# Patient Record
Sex: Female | Born: 1995
Health system: Southern US, Community
[De-identification: ages and names within clinical notes are randomized; demographics above are authoritative.]

## PROBLEM LIST (undated history)

## (undated) DIAGNOSIS — Z789 Other specified health status: Secondary | ICD-10-CM

## (undated) DIAGNOSIS — L309 Dermatitis, unspecified: Secondary | ICD-10-CM

## (undated) HISTORY — DX: Dermatitis, unspecified: L30.9

## (undated) HISTORY — PX: NO PAST SURGERIES: SHX2092

---
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
## 2017-05-08 ENCOUNTER — Encounter (HOSPITAL_COMMUNITY): Payer: Self-pay | Admitting: Emergency Medicine

## 2017-05-08 ENCOUNTER — Other Ambulatory Visit: Payer: Self-pay

## 2017-05-08 ENCOUNTER — Emergency Department (HOSPITAL_COMMUNITY)
Admission: EM | Admit: 2017-05-08 | Discharge: 2017-05-08 | Disposition: A | Discharge status: Home / Self Care | Payer: No Typology Code available for payment source | Attending: Emergency Medicine | Admitting: Emergency Medicine

## 2017-05-08 DIAGNOSIS — M7918 Myalgia, other site: Secondary | ICD-10-CM | POA: Insufficient documentation
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
## 2017-05-08 NOTE — Discharge Instructions (Signed)
Please read attached information. If you experience any new or worsening signs or symptoms please return to the emergency room for evaluation. Please follow-up with your primary care provider or specialist as discussed.  °

## 2017-05-10 ENCOUNTER — Encounter (HOSPITAL_BASED_OUTPATIENT_CLINIC_OR_DEPARTMENT_OTHER): Payer: Self-pay | Admitting: *Deleted

## 2017-05-10 ENCOUNTER — Emergency Department (HOSPITAL_BASED_OUTPATIENT_CLINIC_OR_DEPARTMENT_OTHER)
Admission: EM | Admit: 2017-05-10 | Discharge: 2017-05-10 | Disposition: A | Discharge status: Home / Self Care | Payer: No Typology Code available for payment source | Attending: Physician Assistant | Admitting: Physician Assistant

## 2017-05-10 ENCOUNTER — Emergency Department (HOSPITAL_BASED_OUTPATIENT_CLINIC_OR_DEPARTMENT_OTHER): Payer: No Typology Code available for payment source

## 2017-05-10 ENCOUNTER — Other Ambulatory Visit: Payer: Self-pay

## 2017-05-10 DIAGNOSIS — M25511 Pain in right shoulder: Secondary | ICD-10-CM | POA: Diagnosis not present

## 2017-05-10 DIAGNOSIS — M549 Dorsalgia, unspecified: Secondary | ICD-10-CM | POA: Insufficient documentation

## 2017-05-10 DIAGNOSIS — Z041 Encounter for examination and observation following transport accident: Secondary | ICD-10-CM | POA: Insufficient documentation

## 2017-05-10 LAB — PREGNANCY, URINE: Preg Test, Ur: NEGATIVE

## 2017-05-10 MED ORDER — CYCLOBENZAPRINE HCL 10 MG PO TABS: 10.0000 mg | ORAL_TABLET | Freq: Two times a day (BID) | ORAL | 0 refills | Status: DC | PRN

## 2017-05-10 MED FILL — CYCLOBENZAPRINE 10 MG TAB: 10 | 5 days supply | Qty: 10 | Fill #0

## 2017-05-10 NOTE — ED Notes (Signed)
ED Provider at bedside. 
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
## 2017-05-10 NOTE — ED Notes (Signed)
Pt educated about sedating properties of muscle relaxer (prescription) and importance of not driving or doing other critical tasks (such as operating heavy machinery, caring for infant/toddler/child) while taking medication. Also educated about not taking with alcohol or other medications with sedating properties (such as narcotics, Benadryl). Pt/caregiver verbalized understanding.  

## 2017-05-10 NOTE — Discharge Instructions (Signed)
Please follow up with the primary care physician as needed.
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
## 2017-08-10 ENCOUNTER — Other Ambulatory Visit: Payer: Self-pay

## 2017-08-10 ENCOUNTER — Inpatient Hospital Stay (HOSPITAL_COMMUNITY)
Admission: AD | Admit: 2017-08-10 | Discharge: 2017-08-10 | Disposition: A | Discharge status: Home / Self Care | Payer: BLUE CROSS/BLUE SHIELD | Source: Ambulatory Visit | Attending: Obstetrics and Gynecology | Admitting: Obstetrics and Gynecology

## 2017-08-10 ENCOUNTER — Encounter (HOSPITAL_COMMUNITY): Payer: Self-pay

## 2017-08-10 DIAGNOSIS — Z30016 Encounter for initial prescription of transdermal patch hormonal contraceptive device: Secondary | ICD-10-CM | POA: Diagnosis not present

## 2017-08-10 DIAGNOSIS — N939 Abnormal uterine and vaginal bleeding, unspecified: Secondary | ICD-10-CM

## 2017-08-10 DIAGNOSIS — Z8742 Personal history of other diseases of the female genital tract: Secondary | ICD-10-CM | POA: Diagnosis not present

## 2017-08-10 DIAGNOSIS — R109 Unspecified abdominal pain: Secondary | ICD-10-CM | POA: Diagnosis not present

## 2017-08-10 HISTORY — DX: Other specified health status: Z78.9

## 2017-08-10 LAB — CBC
HCT: 34.1 % — ABNORMAL LOW (ref 36.0–46.0)
Hemoglobin: 11.4 g/dL — ABNORMAL LOW (ref 12.0–15.0)
MCH: 26.1 pg (ref 26.0–34.0)
MCHC: 33.4 g/dL (ref 30.0–36.0)
MCV: 78 fL (ref 78.0–100.0)
Platelets: 275 10*3/uL (ref 150–400)
RBC: 4.37 MIL/uL (ref 3.87–5.11)
RDW: 14.5 % (ref 11.5–15.5)
WBC: 4.7 10*3/uL (ref 4.0–10.5)

## 2017-08-10 LAB — URINALYSIS, ROUTINE W REFLEX MICROSCOPIC

## 2017-08-10 LAB — URINALYSIS, MICROSCOPIC (REFLEX)

## 2017-08-10 LAB — POCT PREGNANCY, URINE: Preg Test, Ur: NEGATIVE

## 2017-08-10 MED ORDER — KETOROLAC TROMETHAMINE 60 MG/2ML IM SOLN
60.0000 mg | Freq: Once | INTRAMUSCULAR | Status: AC
  Administered 2017-08-10: 60 mg via INTRAMUSCULAR
  Filled 2017-08-10: qty 2

## 2017-08-10 MED ORDER — NORELGESTROMIN-ETH ESTRADIOL 150-35 MCG/24HR TD PTWK: 1.0000 | MEDICATED_PATCH | TRANSDERMAL | 0 refills | Status: DC

## 2017-08-10 MED ORDER — NORGESTIMATE-ETH ESTRADIOL 0.25-35 MG-MCG PO TABS: 1.0000 | ORAL_TABLET | Freq: Two times a day (BID) | ORAL | 0 refills | Status: DC

## 2017-08-10 NOTE — MAU Provider Note (Signed)
Chief Complaint: Vaginal Bleeding   First Provider Initiated Contact with Patient 08/10/17 1824      SUBJECTIVE HPI: Katie Fitzpatrick is a 22 y.o. G0P0 not currently pregnant who presents to maternity admissions reporting vaginal bleeding for 1 month. Pt reports hx of abnormal vaginal bleeding, was on birth control pills in the past but stopped taking medication in 2017 due to forgetting taking medication. She reports continued vaginal bleeding since January. She reports that the bleeding started off as her normal period, it continued for 7 days then started turning dark red and lightening up to "where the patient thought her bleeding was about to stop", then it continued. She was seen by her OB at [facility] on 07/25/17 and started on Prometrium 200MG  capsules, she took the medication for 10 days but her bleeding continued to get worse. She endorses abdominal cramping with bleeding that she rates pain a 4/10- has not taken medication for pain. She denies recent IC, vaginal itching/burning, urinary symptoms, h/a, dizziness, n/v, or fever/chills.    Past Medical History:  Diagnosis Date  . Medical history non-contributory    Past Surgical History:  Procedure Laterality Date  . NO PAST SURGERIES     Social History   Socioeconomic History  . Marital status: Unknown    Spouse name: Not on file  . Number of children: Not on file  . Years of education: Not on file  . Highest education level: Not on file  Social Needs  . Financial resource strain: Not on file  . Food insecurity - worry: Not on file  . Food insecurity - inability: Not on file  . Transportation needs - medical: Not on file  . Transportation needs - non-medical: Not on file  Occupational History  . Not on file  Tobacco Use  . Smoking status: Never Smoker  . Smokeless tobacco: Never Used  Substance and Sexual Activity  . Alcohol use: No    Frequency: Never  . Drug use: No  . Sexual activity: Not Currently    Birth  control/protection: None  Other Topics Concern  . Not on file  Social History Narrative  . Not on file   No current facility-administered medications on file prior to encounter.    Current Outpatient Medications on File Prior to Encounter  Medication Sig Dispense Refill  . cyclobenzaprine (FLEXERIL) 10 MG tablet Take 1 tablet (10 mg total) 2 (two) times daily as needed by mouth for muscle spasms. 10 tablet 0   No Known Allergies  ROS:  Review of Systems  Constitutional: Negative.   Respiratory: Negative.   Cardiovascular: Negative.   Gastrointestinal: Positive for abdominal pain. Negative for constipation, diarrhea, nausea and vomiting.  Genitourinary: Positive for menstrual problem and vaginal bleeding. Negative for difficulty urinating, dysuria, frequency, pelvic pain, urgency and vaginal pain.  Musculoskeletal: Negative.   Neurological: Negative.   Psychiatric/Behavioral: Negative.    I have reviewed patient's Past Medical Hx, Surgical Hx, Family Hx, Social Hx, medications and allergies.   Physical Exam   Patient Vitals for the past 24 hrs:  BP Temp Temp src Pulse Resp Height Weight  08/10/17 1810 125/75 98.6 F (37 C) Oral (!) 125 18 - -  08/10/17 1756 - - - - - 5\' 4"  (1.626 m) 253 lb (114.8 kg)   Constitutional: Well-developed, obese female in no acute distress.  Cardiovascular: normal rate Respiratory: normal effort GI: Abd soft, non-tender. Pos BS x 4 MS: Extremities nontender, no edema, normal ROM Neurologic: Alert and oriented  x 4.  GU: Neg CVAT.  PELVIC EXAM: Cervix pink, visually open, without lesion, moderate red vaginal bleeding actively coming from cervix, vaginal walls and external genitalia normal Bimanual exam: Cervix 0.5/long/high, firm, anterior, neg CMT, uterus nontender, nonenlarged, adnexa without tenderness, enlargement, or mass  LAB RESULTS Results for orders placed or performed during the hospital encounter of 08/10/17 (from the past 24 hour(s))   Pregnancy, urine POC     Status: None   Collection Time: 08/10/17  6:20 PM  Result Value Ref Range   Preg Test, Ur NEGATIVE NEGATIVE  CBC     Status: Abnormal   Collection Time: 08/10/17  6:48 PM  Result Value Ref Range   WBC 4.7 4.0 - 10.5 K/uL   RBC 4.37 3.87 - 5.11 MIL/uL   Hemoglobin 11.4 (L) 12.0 - 15.0 g/dL   HCT 16.134.1 (L) 09.636.0 - 04.546.0 %   MCV 78.0 78.0 - 100.0 fL   MCH 26.1 26.0 - 34.0 pg   MCHC 33.4 30.0 - 36.0 g/dL   RDW 40.914.5 81.111.5 - 91.415.5 %   Platelets 275 150 - 400 K/uL    MAU Management/MDM: Orders Placed This Encounter  Procedures  . Urinalysis, Routine w reflex microscopic  . CBC  . Pregnancy, urine POC  CBC- WNL UA- unable to be done due to excessive bleeding in cup   Meds ordered this encounter  Medications  . ketorolac (TORADOL) injection 60 mg  . norgestimate-ethinyl estradiol (ORTHO-CYCLEN,SPRINTEC,PREVIFEM) 0.25-35 MG-MCG tablet    Sig: Take 1 tablet by mouth 2 (two) times daily for 5 days.    Dispense:  1 Package    Refill:  0    Order Specific Question:   Supervising Provider    Answer:   CONSTANT, PEGGY [[id number]]  . norelgestromin-ethinyl estradiol (ORTHO EVRA) 150-35 MCG/24HR transdermal patch    Sig: Place 1 patch onto the skin once a week.    Dispense:  4 patch    Refill:  0    Order Specific Question:   Supervising Provider    Answer:   CONSTANT, PEGGY [[id number]]   Treatments in MAU included 60mg  Toradol IM for abdominal pain- pain relieved with medication treatment.   Discussed birth control methods with patient. Pt agrees to transdermal patch due to not remembering to take pills and being concerned with LARCs. Discussed pt's concerns and answered questions. Pt agrees to transdermal patch with her OBGYN managing.   Pt discharged. Pt stable at time of discharge.   ASSESSMENT 1. Abnormal uterine bleeding (AUB)   2. Abdominal cramping   3. History of irregular menstrual bleeding   4. Encounter for initial prescription of transdermal patch hormonal  contraceptive device     PLAN Discharge home. Pt stable at discharge.  Rx for Sprintec to be taken BID x5days to stop bleeding  Rx for transdermal patch to be started after bleeding has stopped  Follow up with OBGYN in 1 month for management of birth control or worsening symptoms   Allergies as of 08/10/2017   No Known Allergies     Medication List    TAKE these medications   cyclobenzaprine 10 MG tablet Commonly known as:  FLEXERIL Take 1 tablet (10 mg total) 2 (two) times daily as needed by mouth for muscle spasms.   norelgestromin-ethinyl estradiol 150-35 MCG/24HR transdermal patch Commonly known as:  ORTHO EVRA Place 1 patch onto the skin once a week. Start taking on:  08/15/2017   norgestimate-ethinyl estradiol 0.25-35 MG-MCG tablet Commonly known as:  ORTHO-CYCLEN,SPRINTEC,PREVIFEM Take 1 tablet by mouth 2 (two) times daily for 5 days.       Steward Drone  Certified Nurse-Midwife 08/10/2017  7:43 PM

## 2017-08-10 NOTE — Progress Notes (Addendum)
Assumed care of pt. Pt here due to heavy bleeding. Labs pending  UPT negative  1917: provider at bs re-assessing and going over the results of the labs.   1921: d/c instructions given with pt understanding. Pt left unit via ambulatory

## 2017-08-10 NOTE — MAU Note (Signed)
Pt presents to MAU with c/o of ongoing vaginal bleeding x 1 month. Pt has had intermittent abdominal cramping for over a week.

## 2017-08-10 NOTE — Discharge Instructions (Signed)
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
## 2019-04-21 ENCOUNTER — Encounter (INDEPENDENT_AMBULATORY_CARE_PROVIDER_SITE_OTHER): Payer: Self-pay

## 2019-12-17 ENCOUNTER — Other Ambulatory Visit: Payer: Self-pay

## 2019-12-17 ENCOUNTER — Encounter: Payer: Self-pay | Admitting: Nurse Practitioner

## 2019-12-17 ENCOUNTER — Ambulatory Visit (INDEPENDENT_AMBULATORY_CARE_PROVIDER_SITE_OTHER): Payer: 59 | Admitting: Nurse Practitioner

## 2019-12-17 VITALS — BP 118/64 | HR 90 | Temp 96.6°F | Ht 65.35 in | Wt 256.4 lb

## 2019-12-17 DIAGNOSIS — Z13220 Encounter for screening for lipoid disorders: Secondary | ICD-10-CM

## 2019-12-17 DIAGNOSIS — Z136 Encounter for screening for cardiovascular disorders: Secondary | ICD-10-CM

## 2019-12-17 DIAGNOSIS — L308 Other specified dermatitis: Secondary | ICD-10-CM | POA: Diagnosis not present

## 2019-12-17 DIAGNOSIS — Z0000 Encounter for general adult medical examination without abnormal findings: Secondary | ICD-10-CM

## 2019-12-17 DIAGNOSIS — R739 Hyperglycemia, unspecified: Secondary | ICD-10-CM | POA: Diagnosis not present

## 2019-12-17 DIAGNOSIS — Z6841 Body Mass Index (BMI) 40.0 and over, adult: Secondary | ICD-10-CM

## 2019-12-17 DIAGNOSIS — L309 Dermatitis, unspecified: Secondary | ICD-10-CM | POA: Insufficient documentation

## 2019-12-17 LAB — COMPREHENSIVE METABOLIC PANEL
ALT: 16 U/L (ref 0–35)
AST: 18 U/L (ref 0–37)
Albumin: 4.2 g/dL (ref 3.5–5.2)
Alkaline Phosphatase: 42 U/L (ref 39–117)
BUN: 10 mg/dL (ref 6–23)
CO2: 27 mEq/L (ref 19–32)
Calcium: 9.1 mg/dL (ref 8.4–10.5)
Chloride: 106 mEq/L (ref 96–112)
Creatinine, Ser: 0.68 mg/dL (ref 0.40–1.20)
GFR: 130.93 mL/min (ref >60.00)
Glucose, Bld: 117 mg/dL — ABNORMAL HIGH (ref 70–99)
Potassium: 4.1 mEq/L (ref 3.5–5.1)
Sodium: 140 mEq/L (ref 135–145)
Total Bilirubin: 0.2 mg/dL (ref 0.2–1.2)
Total Protein: 6.9 g/dL (ref 6.0–8.3)

## 2019-12-17 LAB — CBC
HCT: 36.8 % (ref 36.0–46.0)
Hemoglobin: 12.1 g/dL (ref 12.0–15.0)
MCHC: 32.9 g/dL (ref 30.0–36.0)
MCV: 77.1 fl — ABNORMAL LOW (ref 78.0–100.0)
Platelets: 285 10*3/uL (ref 150.0–400.0)
RBC: 4.78 Mil/uL (ref 3.87–5.11)
RDW: 16.5 % — ABNORMAL HIGH (ref 11.5–15.5)
WBC: 4.9 10*3/uL (ref 4.0–10.5)

## 2019-12-17 LAB — LIPID PANEL
Cholesterol: 141 mg/dL (ref 0–200)
HDL: 32 mg/dL — ABNORMAL LOW (ref >39.00)
LDL Cholesterol: 95 mg/dL (ref 0–99)
NonHDL: 108.61
Total CHOL/HDL Ratio: 4
Triglycerides: 67 mg/dL (ref 0.0–149.0)
VLDL: 13.4 mg/dL (ref 0.0–40.0)

## 2019-12-17 LAB — TSH: TSH: 2.71 u[IU]/mL (ref 0.35–4.50)

## 2019-12-17 LAB — HEMOGLOBIN A1C: Hgb A1c MFr Bld: 5.6 % (ref 4.6–6.5)

## 2019-12-17 MED ORDER — CERAVE SA RENEWING EX CREA: 1.0000 "application " | TOPICAL_CREAM | Freq: Two times a day (BID) | CUTANEOUS | Status: DC

## 2019-12-17 NOTE — Patient Instructions (Signed)
Go to lab for blood draw  Start DASH diet and daily exercise to promote weight loss and adequate glucose control.  We will obtain recent PAP results from your dermatologist  Thank you for choosing [facility] for your health needs.   Preventive Care 22-22 Years Old, Female Preventive care refers to lifestyle choices and visits with your health care provider that can promote health and wellness. At this stage in your life, you may start seeing a primary care physician instead of a pediatrician. Your health care is now your responsibility. Preventive care for young adults includes:  A yearly physical exam. This is also called an annual wellness visit.  Regular dental and eye exams.  Immunizations.  Screening for certain conditions.  Healthy lifestyle choices, such as diet and exercise. What can I expect for my preventive care visit? Physical exam Your health care provider may check:  Height and weight. These may be used to calculate body mass index (BMI), which is a measurement that tells if you are at a healthy weight.  Heart rate and blood pressure.  Body temperature. Counseling Your health care provider may ask you questions about:  Past medical problems and family medical history.  Alcohol, tobacco, and drug use.  Home and relationship well-being.  Access to firearms.  Emotional well-being.  Diet, exercise, and sleep habits.  Sexual activity and sexual health.  Method of birth control.  Menstrual cycle.  Pregnancy history. What immunizations do I need?  Influenza (flu) vaccine  This is recommended every year. Tetanus, diphtheria, and pertussis (Tdap) vaccine  You may need a Td booster every 10 years. Varicella (chickenpox) vaccine  You may need this vaccine if you have not already been vaccinated. Human papillomavirus (HPV) vaccine  If recommended by your health care provider, you may need three doses over 6 months. Measles, mumps, and  rubella (MMR) vaccine  You may need at least one dose of MMR. You may also need a second dose. Meningococcal conjugate (MenACWY) vaccine  One dose is recommended if you are 22-75 years old and a Market researcher living in a residence hall, or if you have one of several medical conditions. You may also need additional booster doses. Pneumococcal conjugate (PCV13) vaccine  You may need this if you have certain conditions and were not previously vaccinated. Pneumococcal polysaccharide (PPSV23) vaccine  You may need one or two doses if you smoke cigarettes or if you have certain conditions. Hepatitis A vaccine  You may need this if you have certain conditions or if you travel or work in places where you may be exposed to hepatitis A. Hepatitis B vaccine  You may need this if you have certain conditions or if you travel or work in places where you may be exposed to hepatitis B. Haemophilus influenzae type b (Hib) vaccine  You may need this if you have certain risk factors. You may receive vaccines as individual doses or as more than one vaccine together in one shot (combination vaccines). Talk with your health care provider about the risks and benefits of combination vaccines. What tests do I need? Blood tests  Lipid and cholesterol levels. These may be checked every 5 years starting at age 22.  Hepatitis C test.  Hepatitis B test. Screening  Pelvic exam and Pap test. This may be done every 3 years starting at age 22.  Sexually transmitted disease (STD) testing, if you are at risk.  BRCA-related cancer screening. This may be done if you have  a family history of breast, ovarian, tubal, or peritoneal cancers. Other tests  Tuberculosis skin test.  Vision and hearing tests.  Skin exam.  Breast exam. Follow these instructions at home: Eating and drinking   Eat a diet that includes fresh fruits and vegetables, whole grains, lean protein, and low-fat dairy  products.  Drink enough fluid to keep your urine pale yellow.  Do not drink alcohol if: ? Your health care provider tells you not to drink. ? You are pregnant, may be pregnant, or are planning to become pregnant. ? You are under the legal drinking age. In the [place] the legal drinking age is 22.  If you drink alcohol: ? Limit how much you have to 0-1 drink a day. ? Be aware of how much alcohol is in your drink. In the [place] one drink equals one 12 oz bottle of beer (355 mL), one 5 oz glass of wine (148 mL), or one 1 oz glass of hard liquor (44 mL). Lifestyle  Take daily care of your teeth and gums.  Stay active. Exercise at least 30 minutes 5 or more days of the week.  Do not use any products that contain nicotine or tobacco, such as cigarettes, e-cigarettes, and chewing tobacco. If you need help quitting, ask your health care provider.  Do not use drugs.  If you are sexually active, practice safe sex. Use a condom or other form of birth control (contraception) in order to prevent pregnancy and STIs (sexually transmitted infections). If you plan to become pregnant, see your health care provider for a pre-conception visit.  Find healthy ways to cope with stress, such as: ? Meditation, yoga, or listening to music. ? Journaling. ? Talking to a trusted person. ? Spending time with friends and family. Safety  Always wear your seat belt while driving or riding in a vehicle.  Do not drive if you have been drinking alcohol. Do not ride with someone who has been drinking.  Do not drive when you are tired or distracted. Do not text while driving.  Wear a helmet and other protective equipment during sports activities.  If you have firearms in your house, make sure you follow all gun safety procedures.  Seek help if you have been bullied, physically abused, or sexually abused.  Use the Internet responsibly to avoid dangers such as online bullying and online sex predators. What's  next?  Go to your health care provider once a year for a well check visit.  Ask your health care provider how often you should have your eyes and teeth checked.  Stay up to date on all vaccines. This information is not intended to replace advice given to you by your health care provider. Make sure you discuss any questions you have with your health care provider. Document Revised: 06/08/2018 Document Reviewed: 06/08/2018 Elsevier Patient Education  2020 [employer].

## 2019-12-17 NOTE — Assessment & Plan Note (Signed)
Managed by dermatologist 

## 2019-12-17 NOTE — Assessment & Plan Note (Signed)
Advised about her risk for diabetes, cardiovascular disease and OA. Advised to start heart healthy diet, decrease potion size and daily exercise( 30-86mins per day)

## 2019-12-17 NOTE — Progress Notes (Signed)
Subjective:    Patient ID: Katie Fitzpatrick, female    DOB: [birth year], 24 y.o.   MRN: [id number]  Patient presents today for complete physical   HPI Hx of Eczema: No improvement with triamcinolone cream Has appt with dermatologist today.  Sexual History (orientation,birth control, marital status, STD):use of OCP, up to date with pelvic and breast exam, completed by GYN per patient, no need for STD screen  Depression/Suicide: Depression screen [facility] 2/9 12/17/2019  Decreased Interest 0  Down, Depressed, Hopeless 0  PHQ - 2 Score 0   Vision:not needed  Dental:has upcoming appt  Immunizations: (TDAP, Hep C screen, Pneumovax, Influenza, zoster)  Health Maintenance  Topic Date Due  . COVID-19 Vaccine (1) Never done  . Pap Smear  Never done  . Pap Smear  Never done  . Tetanus Vaccine  12/16/2020*  .  Hepatitis C: One time screening is recommended by Center for Disease Control  (CDC) for  adults born from [birth year] through 1963.   12/16/2020*  . HIV Screening  12/16/2020*  . Flu Shot  01/27/2020  *Topic was postponed. The date shown is not the original due date.   Diet:regular.  Weight:  Wt Readings from Last 3 Encounters:  12/17/19 256 lb 6.4 oz (116.3 kg)  08/10/17 253 lb (114.8 kg) (>99 %, Z= 2.53)*  05/10/17 240 lb (108.9 kg) (>99 %, Z= 2.41)*   * Growth percentiles are based on CDC (Girls, 2-20 Years) data.    Exercise:none  Fall Risk: Fall Risk  12/17/2019  Falls in the past year? 0  Number falls in past yr: 0  Injury with Fall? 0   Medications and allergies reviewed with patient and updated if appropriate.  Patient Active Problem List   Diagnosis Date Noted  . Hyperglycemia 12/17/2019  . Eczema 12/17/2019    Current Outpatient Medications on File Prior to Visit  Medication Sig Dispense Refill  . cyclobenzaprine (FLEXERIL) 10 MG tablet Take 1 tablet (10 mg total) 2 (two) times daily as needed by mouth for muscle spasms. (Patient not taking: Reported on 12/17/2019)  10 tablet 0  . norelgestromin-ethinyl estradiol (ORTHO EVRA) 150-35 MCG/24HR transdermal patch Place 1 patch onto the skin once a week. (Patient not taking: Reported on 12/17/2019) 4 patch 0  . norgestimate-ethinyl estradiol (ORTHO-CYCLEN,SPRINTEC,PREVIFEM) 0.25-35 MG-MCG tablet Take 1 tablet by mouth 2 (two) times daily for 5 days. (Patient not taking: Reported on 12/17/2019) 1 Package 0   No current facility-administered medications on file prior to visit.    Past Medical History:  Diagnosis Date  . Medical history non-contributory     Past Surgical History:  Procedure Laterality Date  . NO PAST SURGERIES      Social History   Socioeconomic History  . Marital status: Single    Spouse name: Not on file  . Number of children: 0  . Years of education: Not on file  . Highest education level: Not on file  Occupational History  . Not on file  Tobacco Use  . Smoking status: Never Smoker  . Smokeless tobacco: Never Used  Substance and Sexual Activity  . Alcohol use: No  . Drug use: No  . Sexual activity: Not Currently    Birth control/protection: Pill  Other Topics Concern  . Not on file  Social History Narrative  . Not on file   Social Determinants of Health   Financial Resource Strain:   . Difficulty of Paying Living Expenses:   Food Insecurity:   . Worried  About Running Out of Food in the Last Year:   . Ran Out of Food in the Last Year:   Transportation Needs:   . Lack of Transportation (Medical):   Marland Kitchen Lack of Transportation (Non-Medical):   Physical Activity:   . Days of Exercise per Week:   . Minutes of Exercise per Session:   Stress:   . Feeling of Stress :   Social Connections:   . Frequency of Communication with Friends and Family:   . Frequency of Social Gatherings with Friends and Family:   . Attends Religious Services:   . Active Member of Clubs or Organizations:   . Attends Banker Meetings:   Marland Kitchen Marital Status:     Family History    Problem Relation Age of Onset  . Diabetes Maternal Grandmother         Review of Systems  Constitutional: Negative for fever, malaise/fatigue and weight loss.  HENT: Negative for congestion and sore throat.   Eyes:       Negative for visual changes  Respiratory: Negative for cough and shortness of breath.   Cardiovascular: Negative for chest pain, palpitations and leg swelling.  Gastrointestinal: Negative for blood in stool, constipation, diarrhea and heartburn.  Genitourinary: Negative for dysuria, frequency and urgency.  Musculoskeletal: Negative for falls, joint pain and myalgias.  Skin: Positive for rash.  Neurological: Negative for dizziness, sensory change and headaches.  Endo/Heme/Allergies: Does not bruise/bleed easily.  Psychiatric/Behavioral: Negative for depression, substance abuse and suicidal ideas. The patient is not nervous/anxious.    Objective:   Vitals:   12/17/19 0901  BP: 118/64  Pulse: 90  Temp: (!) 96.6 F (35.9 C)  SpO2: 97%   Body mass index is 42.21 kg/m.   Physical Examination:  Physical Exam Vitals reviewed.  Constitutional:      General: She is not in acute distress.    Appearance: She is well-developed. She is obese.  HENT:     Right Ear: Tympanic membrane, ear canal and external ear normal.     Left Ear: Tympanic membrane, ear canal and external ear normal.  Eyes:     Extraocular Movements: Extraocular movements intact.     Conjunctiva/sclera: Conjunctivae normal.  Neck:     Thyroid: No thyroid mass, thyromegaly or thyroid tenderness.  Cardiovascular:     Rate and Rhythm: Normal rate and regular rhythm.     Heart sounds: Normal heart sounds.  Pulmonary:     Effort: Pulmonary effort is normal. No respiratory distress.     Breath sounds: Normal breath sounds.  Chest:     Chest wall: No tenderness.  Abdominal:     General: Bowel sounds are normal.     Palpations: Abdomen is soft.  Musculoskeletal:        General: Normal range  of motion.     Cervical back: Normal range of motion and neck supple.  Lymphadenopathy:     Cervical: No cervical adenopathy.  Skin:    General: Skin is warm and dry.     Findings: Rash present.  Neurological:     Mental Status: She is alert and oriented to person, place, and time.     Deep Tendon Reflexes: Reflexes are normal and symmetric.  Psychiatric:        Mood and Affect: Mood normal.        Behavior: Behavior normal.        Thought Content: Thought content normal.     ASSESSMENT and PLAN: This visit  occurred during the SARS-CoV-2 public health emergency.  Safety protocols were in place, including screening questions prior to the visit, additional usage of staff PPE, and extensive cleaning of exam room while observing appropriate contact time as indicated for disinfecting solutions.   Katie Fitzpatrick was seen today for establish care.  Diagnoses and all orders for this visit:  Preventative health care -     CBC -     Comprehensive metabolic panel -     Lipid panel -     TSH  Encounter for lipid screening for cardiovascular disease -     Lipid panel  Hyperglycemia -     Hemoglobin A1c  Other eczema -     Emollient (CERAVE SA RENEWING) CREA; Apply 1 application topically in the morning and at bedtime.    No problem-specific Assessment & Plan notes found for this encounter.      Problem List Items Addressed This Visit      Musculoskeletal and Integument   Eczema   Relevant Medications   Emollient (CERAVE SA RENEWING) CREA     Other   Hyperglycemia   Relevant Orders   Hemoglobin A1c    Other Visit Diagnoses    Preventative health care    -  Primary   Relevant Orders   CBC   Comprehensive metabolic panel   Lipid panel   TSH   Encounter for lipid screening for cardiovascular disease       Relevant Orders   Lipid panel      Follow up: Return in about 1 year (around 12/16/2020) for CPE (fasting).  Wilfred Lacy, NP

## 2020-01-04 ENCOUNTER — Encounter: Payer: Self-pay | Admitting: Nurse Practitioner

## 2020-07-19 ENCOUNTER — Emergency Department (HOSPITAL_BASED_OUTPATIENT_CLINIC_OR_DEPARTMENT_OTHER)
Admission: EM | Admit: 2020-07-19 | Discharge: 2020-07-19 | Disposition: A | Discharge status: Home / Self Care | Payer: 59 | Attending: Emergency Medicine | Admitting: Emergency Medicine

## 2020-07-19 ENCOUNTER — Encounter (HOSPITAL_BASED_OUTPATIENT_CLINIC_OR_DEPARTMENT_OTHER): Payer: Self-pay | Admitting: Emergency Medicine

## 2020-07-19 ENCOUNTER — Other Ambulatory Visit: Payer: Self-pay

## 2020-07-19 DIAGNOSIS — Y92410 Unspecified street and highway as the place of occurrence of the external cause: Secondary | ICD-10-CM | POA: Insufficient documentation

## 2020-07-19 DIAGNOSIS — S161XXA Strain of muscle, fascia and tendon at neck level, initial encounter: Secondary | ICD-10-CM | POA: Diagnosis not present

## 2020-07-19 DIAGNOSIS — T148XXA Other injury of unspecified body region, initial encounter: Secondary | ICD-10-CM

## 2020-07-19 DIAGNOSIS — S199XXA Unspecified injury of neck, initial encounter: Secondary | ICD-10-CM | POA: Diagnosis present

## 2020-07-19 DIAGNOSIS — M25512 Pain in left shoulder: Secondary | ICD-10-CM | POA: Insufficient documentation

## 2020-07-19 MED ORDER — CYCLOBENZAPRINE HCL 10 MG PO TABS: 10.0000 mg | ORAL_TABLET | Freq: Every day | ORAL | 0 refills | Status: DC
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
## 2020-07-19 NOTE — Discharge Instructions (Signed)
I recommend a combination of tylenol and ibuprofen for management of your pain. You can take a low dose of both at the same time. I recommend 325 mg of Tylenol combined with 400 mg of ibuprofen. This is one regular Tylenol and two regular ibuprofen. You can take these 2-3 times for day for your pain. Please try to take these medications with a small amount of food as well to prevent upsetting your stomach.  Also, please consider topical pain relieving creams such as Voltaran Gel, BioFreeze, or Icy Hot. There is also a pain relieving cream made by Aleve. You should be able to find all of these at your local pharmacy.   I am prescribing you a strong muscle relaxer called flexeril. Please only take this medication once in the evening with dinner. This medication can make you quite drowsy. Do not mix it with alcohol. Do not drive a vehicle after taking it.   Please return to the ER with any new or worsening symptoms.  It was a pleasure to meet you. 
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
## 2020-07-19 NOTE — ED Notes (Signed)
Reviewed AVS with client, discussed Rx for Flexeril with client and safety while taking this medication. Opportunity for questions provided

## 2021-03-17 ENCOUNTER — Other Ambulatory Visit: Payer: Self-pay

## 2021-03-17 ENCOUNTER — Encounter: Payer: Self-pay | Admitting: Nurse Practitioner

## 2021-03-17 ENCOUNTER — Ambulatory Visit (INDEPENDENT_AMBULATORY_CARE_PROVIDER_SITE_OTHER): Payer: 59 | Admitting: Nurse Practitioner

## 2021-03-17 VITALS — BP 112/70 | HR 81 | Temp 98.7°F | Ht 65.0 in | Wt 246.0 lb

## 2021-03-17 DIAGNOSIS — R739 Hyperglycemia, unspecified: Secondary | ICD-10-CM | POA: Diagnosis not present

## 2021-03-17 DIAGNOSIS — Z136 Encounter for screening for cardiovascular disorders: Secondary | ICD-10-CM

## 2021-03-17 DIAGNOSIS — Z0000 Encounter for general adult medical examination without abnormal findings: Secondary | ICD-10-CM

## 2021-03-17 DIAGNOSIS — Z13220 Encounter for screening for lipoid disorders: Secondary | ICD-10-CM

## 2021-03-17 LAB — CBC WITH DIFFERENTIAL/PLATELET
Basophils Absolute: 0 10*3/uL (ref 0.0–0.1)
Basophils Relative: 0.3 % (ref 0.0–3.0)
Eosinophils Absolute: 0.1 10*3/uL (ref 0.0–0.7)
Eosinophils Relative: 1.3 % (ref 0.0–5.0)
HCT: 36.9 % (ref 36.0–46.0)
Hemoglobin: 11.8 g/dL — ABNORMAL LOW (ref 12.0–15.0)
Lymphocytes Relative: 46.4 % — ABNORMAL HIGH (ref 12.0–46.0)
Lymphs Abs: 2.6 10*3/uL (ref 0.7–4.0)
MCHC: 32 g/dL (ref 30.0–36.0)
MCV: 77.7 fl — ABNORMAL LOW (ref 78.0–100.0)
Monocytes Absolute: 0.3 10*3/uL (ref 0.1–1.0)
Monocytes Relative: 5 % (ref 3.0–12.0)
Neutro Abs: 2.6 10*3/uL (ref 1.4–7.7)
Neutrophils Relative %: 47 % (ref 43.0–77.0)
Platelets: 304 10*3/uL (ref 150.0–400.0)
RBC: 4.75 Mil/uL (ref 3.87–5.11)
RDW: 14.7 % (ref 11.5–15.5)
WBC: 5.5 10*3/uL (ref 4.0–10.5)

## 2021-03-17 LAB — LIPID PANEL
Cholesterol: 133 mg/dL (ref 0–200)
HDL: 41.8 mg/dL (ref >39.00)
LDL Cholesterol: 80 mg/dL (ref 0–99)
NonHDL: 91.57
Total CHOL/HDL Ratio: 3
Triglycerides: 57 mg/dL (ref 0.0–149.0)
VLDL: 11.4 mg/dL (ref 0.0–40.0)

## 2021-03-17 LAB — COMPREHENSIVE METABOLIC PANEL
ALT: 13 U/L (ref 0–35)
AST: 16 U/L (ref 0–37)
Albumin: 4 g/dL (ref 3.5–5.2)
Alkaline Phosphatase: 35 U/L — ABNORMAL LOW (ref 39–117)
BUN: 8 mg/dL (ref 6–23)
CO2: 29 mEq/L (ref 19–32)
Calcium: 9.2 mg/dL (ref 8.4–10.5)
Chloride: 106 mEq/L (ref 96–112)
Creatinine, Ser: 0.64 mg/dL (ref 0.40–1.20)
GFR: 124.73 mL/min (ref >60.00)
Glucose, Bld: 82 mg/dL (ref 70–99)
Potassium: 3.9 mEq/L (ref 3.5–5.1)
Sodium: 141 mEq/L (ref 135–145)
Total Bilirubin: 0.4 mg/dL (ref 0.2–1.2)
Total Protein: 7.5 g/dL (ref 6.0–8.3)

## 2021-03-17 LAB — HEMOGLOBIN A1C: Hgb A1c MFr Bld: 5.9 % (ref 4.6–6.5)

## 2021-03-17 NOTE — Patient Instructions (Addendum)
Go to lab for blood draw. Maintain heart healthy diet and daily exercise to promote weight loss and adequate glucose control.  Preventive Care 72-23 Years Old, Female Preventive care refers to lifestyle choices and visits with your health care provider that can promote health and wellness. This includes: A yearly physical exam. This is also called an annual wellness visit. Regular dental and eye exams. Immunizations. Screening for certain conditions. Healthy lifestyle choices, such as: Eating a healthy diet. Getting regular exercise. Not using drugs or products that contain nicotine and tobacco. Limiting alcohol use. What can I expect for my preventive care visit? Physical exam Your health care provider may check your: Height and weight. These may be used to calculate your BMI (body mass index). BMI is a measurement that tells if you are at a healthy weight. Heart rate and blood pressure. Body temperature. Skin for abnormal spots. Counseling Your health care provider may ask you questions about your: Past medical problems. Family's medical history. Alcohol, tobacco, and drug use. Emotional well-being. Home life and relationship well-being. Sexual activity. Diet, exercise, and sleep habits. Work and work Statistician. Access to firearms. Method of birth control. Menstrual cycle. Pregnancy history. What immunizations do I need? Vaccines are usually given at various ages, according to a schedule. Your health care provider will recommend vaccines for you based on your age, medical history, and lifestyle or other factors, such as travel or where you work. What tests do I need? Blood tests Lipid and cholesterol levels. These may be checked every 5 years starting at age 66. Hepatitis C test. Hepatitis B test. Screening Diabetes screening. This is done by checking your blood sugar (glucose) after you have not eaten for a while (fasting). STD (sexually transmitted disease) testing,  if you are at risk. BRCA-related cancer screening. This may be done if you have a family history of breast, ovarian, tubal, or peritoneal cancers. Pelvic exam and Pap test. This may be done every 3 years starting at age 43. Starting at age 34, this may be done every 5 years if you have a Pap test in combination with an HPV test. Talk with your health care provider about your test results, treatment options, and if necessary, the need for more tests. Follow these instructions at home: Eating and drinking  Eat a healthy diet that includes fresh fruits and vegetables, whole grains, lean protein, and low-fat dairy products. Take vitamin and mineral supplements as recommended by your health care provider. Do not drink alcohol if: Your health care provider tells you not to drink. You are pregnant, may be pregnant, or are planning to become pregnant. If you drink alcohol: Limit how much you have to 0-1 drink a day. Be aware of how much alcohol is in your drink. In the [place] one drink equals one 12 oz bottle of beer (355 mL), one 5 oz glass of wine (148 mL), or one 1 oz glass of hard liquor (44 mL). Lifestyle Take daily care of your teeth and gums. Brush your teeth every morning and night with fluoride toothpaste. Floss one time each day. Stay active. Exercise for at least 30 minutes 5 or more days each week. Do not use any products that contain nicotine or tobacco, such as cigarettes, e-cigarettes, and chewing tobacco. If you need help quitting, ask your health care provider. Do not use drugs. If you are sexually active, practice safe sex. Use a condom or other form of protection to prevent STIs (sexually transmitted infections). If you do  not wish to become pregnant, use a form of birth control. If you plan to become pregnant, see your health care provider for a prepregnancy visit. Find healthy ways to cope with stress, such as: Meditation, yoga, or listening to music. Journaling. Talking to a  trusted person. Spending time with friends and family. Safety Always wear your seat belt while driving or riding in a vehicle. Do not drive: If you have been drinking alcohol. Do not ride with someone who has been drinking. When you are tired or distracted. While texting. Wear a helmet and other protective equipment during sports activities. If you have firearms in your house, make sure you follow all gun safety procedures. Seek help if you have been physically or sexually abused. What's next? Go to your health care provider once a year for an annual wellness visit. Ask your health care provider how often you should have your eyes and teeth checked. Stay up to date on all vaccines. This information is not intended to replace advice given to you by your health care provider. Make sure you discuss any questions you have with your health care provider. Document Revised: 08/22/2020 Document Reviewed: 02/23/2018 Elsevier Patient Education  2022 [employer].

## 2021-03-17 NOTE — Assessment & Plan Note (Signed)
dupixent injection prescribed by [facility] dermatology

## 2021-03-17 NOTE — Progress Notes (Signed)
Subjective:    Patient ID: Katie Fitzpatrick, female    DOB: 1996-06-15, 25 y.o.   MRN: [id number]  Patient presents today for CPE   HPI  Vision:not needed per patient Dental:will schedule Diet:regular Exercise:none Weight:  Wt Readings from Last 3 Encounters:  03/17/21 246 lb (111.6 kg)  07/19/20 252 lb (114.3 kg)  12/17/19 256 lb 6.4 oz (116.3 kg)    Sexual History (orientation,birth control, marital status, STD):sexually active, denies need for STD screen, up to date with PAP, agreed to breast exam today  Depression/Suicide: Depression screen [facility] 2/9 03/17/2021 12/17/2019  Decreased Interest 0 0  Down, Depressed, Hopeless 0 0  PHQ - 2 Score 0 0   Immunizations: (TDAP, Hep C screen, Pneumovax, Influenza, zoster)  Health Maintenance  Topic Date Due   COVID-19 Vaccine (1) 04/02/2021*   Flu Shot  09/25/2021*   Tetanus Vaccine  03/17/2022*   Hepatitis C Screening: USPSTF Recommendation to screen - Ages 18-79 yo.  03/17/2022*   HIV Screening  03/17/2022*   Pap Smear  05/15/2022   Pap Smear  05/15/2022   HPV Vaccine  Completed  *Topic was postponed. The date shown is not the original due date.   Fall Risk: Fall Risk  03/17/2021 12/17/2019  Falls in the past year? 0 0  Number falls in past yr: 0 0  Injury with Fall? 0 0  Risk for fall due to : No Fall Risks -  Follow up Falls evaluation completed -   Medications and allergies reviewed with patient and updated if appropriate.  Patient Active Problem List   Diagnosis Date Noted   Hyperglycemia 12/17/2019   Eczema 12/17/2019   Class 3 severe obesity due to excess calories with serious comorbidity and body mass index (BMI) of 40.0 to 44.9 in adult [facility]) 12/17/2019    Current Outpatient Medications on File Prior to Visit  Medication Sig Dispense Refill   DUPIXENT 300 MG/2ML SOPN SMARTSIG:1 Pre-Filled Pen Syringe SUB-Q Every 2 Weeks     triamcinolone cream (KENALOG) 0.1 % Apply topically.     No current  facility-administered medications on file prior to visit.    Past Medical History:  Diagnosis Date   Eczema    Medical history non-contributory     Past Surgical History:  Procedure Laterality Date   NO PAST SURGERIES      Social History   Socioeconomic History   Marital status: Single    Spouse name: Not on file   Number of children: 0   Years of education: Not on file   Highest education level: Not on file  Occupational History   Not on file  Tobacco Use   Smoking status: Never   Smokeless tobacco: Never  Vaping Use   Vaping Use: Never used  Substance and Sexual Activity   Alcohol use: Yes    Comment: socially   Drug use: Yes    Types: Marijuana   Sexual activity: Not Currently    Birth control/protection: None  Other Topics Concern   Not on file  Social History Narrative   Not on file   Social Determinants of Health   Financial Resource Strain: Not on file  Food Insecurity: Not on file  Transportation Needs: Not on file  Physical Activity: Not on file  Stress: Not on file  Social Connections: Not on file    Family History  Problem Relation Age of Onset   Diabetes Maternal Grandmother         Review of  Systems  Constitutional:  Negative for fever, malaise/fatigue and weight loss.  HENT:  Negative for congestion and sore throat.   Eyes:        Negative for visual changes  Respiratory:  Negative for cough and shortness of breath.   Cardiovascular:  Negative for chest pain, palpitations and leg swelling.  Gastrointestinal:  Negative for blood in stool, constipation, diarrhea and heartburn.  Genitourinary:  Negative for dysuria, frequency and urgency.  Musculoskeletal:  Negative for falls, joint pain and myalgias.  Skin:  Negative for rash.  Neurological:  Negative for dizziness, sensory change and headaches.  Endo/Heme/Allergies:  Does not bruise/bleed easily.  Psychiatric/Behavioral:  Negative for depression, substance abuse and suicidal ideas.  The patient is not nervous/anxious.    Objective:   Vitals:   03/17/21 1335  BP: 112/70  Pulse: 81  Temp: 98.7 F (37.1 C)  SpO2: 97%    Body mass index is 40.94 kg/m.   Physical Examination:  Physical Exam Vitals reviewed.  Constitutional:      General: She is not in acute distress.    Appearance: She is well-developed. She is obese.  HENT:     Right Ear: Tympanic membrane, ear canal and external ear normal.     Left Ear: Tympanic membrane, ear canal and external ear normal.  Eyes:     Extraocular Movements: Extraocular movements intact.     Conjunctiva/sclera: Conjunctivae normal.  Cardiovascular:     Rate and Rhythm: Normal rate and regular rhythm.     Heart sounds: Normal heart sounds.  Pulmonary:     Effort: Pulmonary effort is normal. No respiratory distress.     Breath sounds: Normal breath sounds.  Chest:     Chest wall: No tenderness.  Breasts:    Breasts are symmetrical.     Right: Normal.     Left: Normal.  Abdominal:     General: Bowel sounds are normal.     Palpations: Abdomen is soft.  Musculoskeletal:        General: Normal range of motion.     Cervical back: Normal range of motion and neck supple.     Right lower leg: No edema.     Left lower leg: No edema.  Lymphadenopathy:     Cervical: No cervical adenopathy.     Upper Body:     Right upper body: No supraclavicular, axillary or pectoral adenopathy.     Left upper body: No supraclavicular, axillary or pectoral adenopathy.  Skin:    General: Skin is warm and dry.  Neurological:     Mental Status: She is alert and oriented to person, place, and time.     Deep Tendon Reflexes: Reflexes are normal and symmetric.  Psychiatric:        Mood and Affect: Mood normal.        Behavior: Behavior normal.        Thought Content: Thought content normal.   ASSESSMENT and PLAN: This visit occurred during the SARS-CoV-2 public health emergency.  Safety protocols were in place, including screening  questions prior to the visit, additional usage of staff PPE, and extensive cleaning of exam room while observing appropriate contact time as indicated for disinfecting solutions.   Katie Fitzpatrick was seen today for annual exam.  Diagnoses and all orders for this visit:  Preventative health care -     Comprehensive metabolic panel -     Lipid panel -     CBC with Differential/Platelet  Encounter for lipid screening  for cardiovascular disease -     Lipid panel  Hyperglycemia -     Hemoglobin A1c     Problem List Items Addressed This Visit       Other   Hyperglycemia   Relevant Orders   Hemoglobin A1c   Other Visit Diagnoses     Preventative health care    -  Primary   Relevant Orders   Comprehensive metabolic panel   Lipid panel   CBC with Differential/Platelet   Encounter for lipid screening for cardiovascular disease       Relevant Orders   Lipid panel       Follow up: Return in about 1 year (around 03/17/2022) for CPE (fasting).  Alysia Penna, NP

## 2021-08-03 ENCOUNTER — Telehealth: Payer: Self-pay | Admitting: Nurse Practitioner

## 2021-08-03 DIAGNOSIS — Z6841 Body Mass Index (BMI) 40.0 and over, adult: Secondary | ICD-10-CM

## 2021-08-03 DIAGNOSIS — R739 Hyperglycemia, unspecified: Secondary | ICD-10-CM

## 2021-08-03 NOTE — Telephone Encounter (Signed)
Pt called and said she would like to get a referral to a nutritionist,  Deniece Ree, RD with [facility] Address: [address] [place], [place] [address] Phone: [phone]  She wants to go since her last lab work with [facility] showed she was pre diabetic  Call back is [phone]

## 2022-03-19 ENCOUNTER — Encounter: Payer: Self-pay | Admitting: Nurse Practitioner

## 2023-09-29 ENCOUNTER — Telehealth: Payer: Self-pay | Admitting: Nurse Practitioner

## 2023-09-29 NOTE — Telephone Encounter (Signed)
 Lvmtcb to schedule appt if needed.
